# Patient Record
Sex: Male | Born: 1975 | Race: White | Hispanic: No | Marital: Married | Smoking: Current every day smoker
Health system: Southern US, Community
[De-identification: ages and names within clinical notes are randomized; demographics above are authoritative.]

## PROBLEM LIST (undated history)

## (undated) DIAGNOSIS — G5600 Carpal tunnel syndrome, unspecified upper limb: Secondary | ICD-10-CM

## (undated) DIAGNOSIS — K219 Gastro-esophageal reflux disease without esophagitis: Secondary | ICD-10-CM

## (undated) DIAGNOSIS — M779 Enthesopathy, unspecified: Secondary | ICD-10-CM

## (undated) DIAGNOSIS — G2581 Restless legs syndrome: Secondary | ICD-10-CM

## (undated) DIAGNOSIS — F419 Anxiety disorder, unspecified: Secondary | ICD-10-CM

## (undated) DIAGNOSIS — J45909 Unspecified asthma, uncomplicated: Secondary | ICD-10-CM

## (undated) DIAGNOSIS — T4145XA Adverse effect of unspecified anesthetic, initial encounter: Secondary | ICD-10-CM

## (undated) DIAGNOSIS — T8859XA Other complications of anesthesia, initial encounter: Secondary | ICD-10-CM

## (undated) DIAGNOSIS — I10 Essential (primary) hypertension: Secondary | ICD-10-CM

## (undated) DIAGNOSIS — J189 Pneumonia, unspecified organism: Secondary | ICD-10-CM

## (undated) DIAGNOSIS — G4730 Sleep apnea, unspecified: Secondary | ICD-10-CM

## (undated) HISTORY — PX: CHOLECYSTECTOMY: SHX55

## (undated) HISTORY — PX: PILONIDAL CYST EXCISION: SHX744

---

## 2004-07-11 ENCOUNTER — Ambulatory Visit: Payer: Self-pay | Admitting: Family Medicine

## 2005-02-16 ENCOUNTER — Ambulatory Visit: Payer: Self-pay | Admitting: Family Medicine

## 2005-03-02 ENCOUNTER — Ambulatory Visit: Payer: Self-pay | Admitting: Family Medicine

## 2012-04-11 ENCOUNTER — Ambulatory Visit
Admission: RE | Admit: 2012-04-11 | Discharge: 2012-04-11 | Disposition: A | Discharge status: Home / Self Care | Payer: BC Managed Care – PPO | Source: Ambulatory Visit | Attending: Family Medicine | Admitting: Family Medicine

## 2012-04-11 ENCOUNTER — Other Ambulatory Visit: Payer: Self-pay | Admitting: Family Medicine

## 2012-04-11 DIAGNOSIS — R109 Unspecified abdominal pain: Secondary | ICD-10-CM

## 2013-08-16 IMAGING — CR DG ABDOMEN 1V
2 series · 2 of 2 positions shown · non-contrast
Comparison: None.

CLINICAL DATA: Lower abdominal pain, constipation.

ABDOMEN - 1 VIEW

[t abdomen supine (1 of 2)]
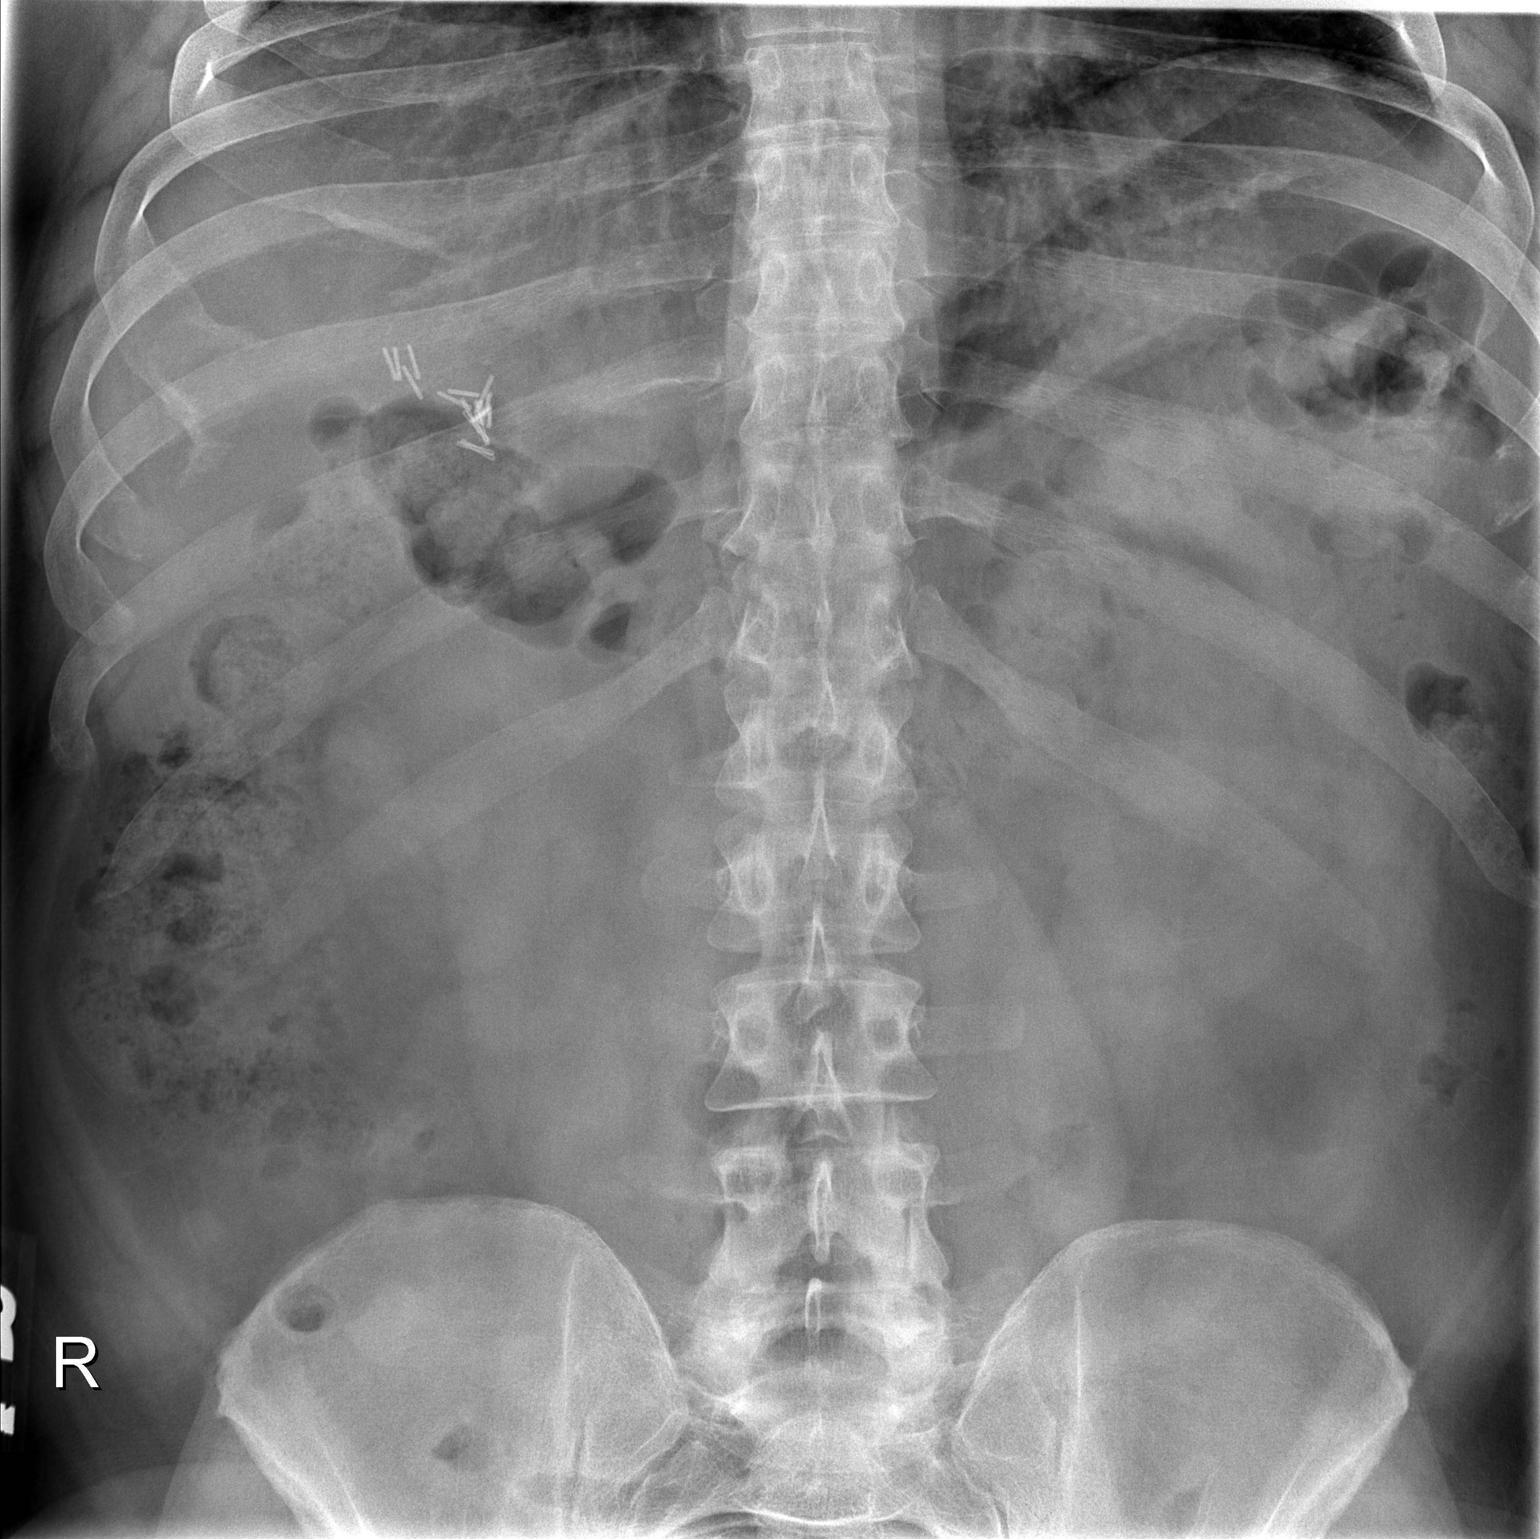

[t abdomen supine (2 of 2)]
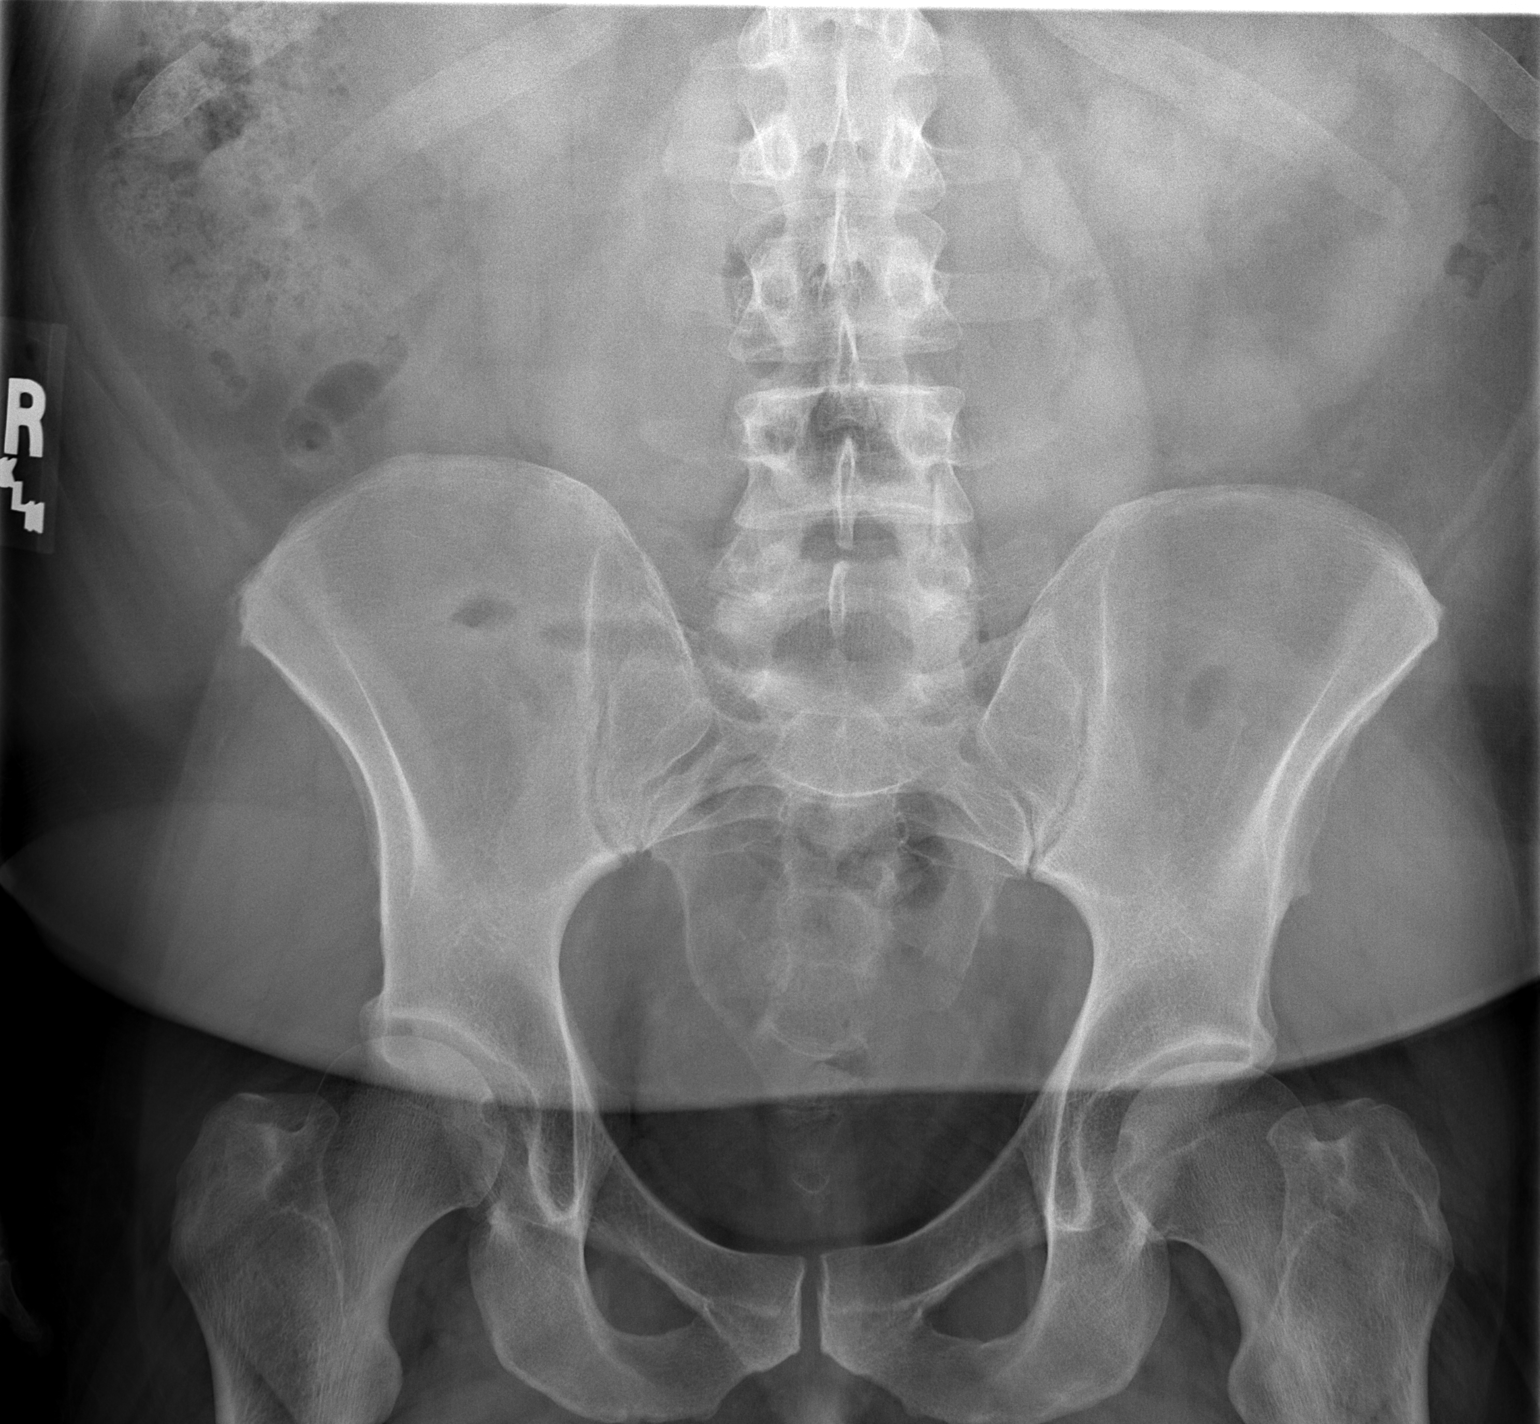

[2 of 2 positions shown; findings below may reference images not displayed]

FINDINGS: Moderate stool burden throughout the colon.  No evidence
of obstruction or free air.  Prior cholecystectomy.  No
organomegaly or suspicious calcification.  No acute bony
abnormality.
IMPRESSION: Moderate stool burden.  No acute findings.

## 2014-07-13 ENCOUNTER — Emergency Department (HOSPITAL_COMMUNITY)
Admission: EM | Admit: 2014-07-13 | Discharge: 2014-07-13 | Disposition: A | Discharge status: Home / Self Care | Payer: BC Managed Care – PPO | Source: Home / Self Care | Attending: Family Medicine | Admitting: Family Medicine

## 2014-07-13 ENCOUNTER — Encounter (HOSPITAL_COMMUNITY): Payer: Self-pay | Admitting: Emergency Medicine

## 2014-07-13 ENCOUNTER — Other Ambulatory Visit: Payer: Self-pay

## 2014-07-13 DIAGNOSIS — R000 Tachycardia, unspecified: Secondary | ICD-10-CM

## 2014-07-13 DIAGNOSIS — F439 Reaction to severe stress, unspecified: Secondary | ICD-10-CM

## 2014-07-13 DIAGNOSIS — F458 Other somatoform disorders: Secondary | ICD-10-CM

## 2014-07-13 DIAGNOSIS — F430 Acute stress reaction: Secondary | ICD-10-CM

## 2014-07-13 DIAGNOSIS — R0989 Other specified symptoms and signs involving the circulatory and respiratory systems: Secondary | ICD-10-CM

## 2014-07-13 DIAGNOSIS — I158 Other secondary hypertension: Secondary | ICD-10-CM

## 2014-07-13 DIAGNOSIS — J069 Acute upper respiratory infection, unspecified: Secondary | ICD-10-CM

## 2014-07-13 HISTORY — DX: Essential (primary) hypertension: I10

## 2014-07-13 MED ORDER — METOPROLOL TARTRATE 25 MG PO TABS: 25.0000 mg | ORAL_TABLET | Freq: Two times a day (BID) | ORAL | Status: DC

## 2014-07-13 MED ORDER — LORAZEPAM 1 MG PO TABS: 1.0000 mg | ORAL_TABLET | Freq: Once | ORAL | Status: DC

## 2014-07-13 NOTE — ED Notes (Signed)
Pt states he thinks he might be having a reaction to cross contamination of seafood from a restaurant last night.  He had tongue swelling, SOB, & HBP last night.  It went away with Benadryl, until this morning at work, where his BP was  180/120 and the same symptoms arose.  Pt has a hx of HBP and is supposed to be on medication, but stated it was too expensive so he has not taken it in a long time.

## 2014-07-13 NOTE — ED Provider Notes (Signed)
CSN: [id number]     Arrival date & time 07/13/14  [id number] History   First MD Initiated Contact with Patient 07/13/14 [phone]     Chief Complaint  Patient presents with  . Hypertension  . Nausea    waves, not currently  . Chest Pain    waves, not currently   (Consider location/radiation/quality/duration/timing/severity/associated sxs/prior Treatment) HPI Comments: 38 year old obese male was eating at a restaurant last night around 7 PM. 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 He was using a cheese dip and during the meal he developed a sensation of swelling in throat and time and chest tightness. He took Benadryl and went to bed after he went home. He felt okay earlier this morning and he will he started developing some of the same symptoms. He has a history of bronchospasm and states that he has had a cough, congestion, occasional shortness of breath and tightness in the mid chest that he usually develops with bronchospasm. His blood pressure was measured at work to be 180/120. At the urgent care is 150/104. His heart rate is 125. 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   Past Medical History  Diagnosis Date  . Hypertension    History reviewed. No pertinent past surgical history. History reviewed. No pertinent family history. History  Substance Use Topics  . Smoking status: Former Smoker    Types: Cigarettes    Quit date: 01/11/2014  . Smokeless tobacco: Never Used  . Alcohol Use: Yes     Comment: occasionally    Review of Systems  Constitutional: Positive for fever, chills and activity change.  HENT: Positive for congestion. Negative for ear pain, postnasal drip, rhinorrhea, sore throat and trouble swallowing.   Eyes: Negative.   Respiratory: Positive for cough, chest tightness and shortness of breath. Negative for apnea.   Cardiovascular: Negative for leg swelling.  Gastrointestinal: Negative for vomiting and  abdominal pain.  Genitourinary: Negative.   Skin: Negative for rash.  Neurological: Positive for light-headedness. Negative for tremors, syncope and speech difficulty.  Psychiatric/Behavioral: The patient is nervous/anxious.     Allergies  [place]  Home Medications   Prior to Admission medications   Medication Sig Start Date End Date Taking? Authorizing Provider  metoprolol tartrate (LOPRESSOR) 25 MG tablet Take 1 tablet (25 mg total) by mouth 2 (two) times daily. 07/13/14   Hayden Rasmussenavid Joselinne Lawal, NP   BP 150/104 mmHg  Pulse 125  Temp(Src) 99.1 F (37.3 C) (Oral)  Resp 16  SpO2 99% Physical Exam  Constitutional: He is oriented to person, place, and time. He appears well-developed and well-nourished.  HENT:  Head: Normocephalic and atraumatic.  Bilateral TMs are normal Oropharynx is clear and moist without exudates. There is mild clear PND. No intraoral swelling or edema.   Tongues is of normal size without erythema. Oropharynx is widely papatent.  Eyes: Conjunctivae and EOM are normal. Pupils are equal, round, and reactive to light.  Neck: Normal range of motion. Neck supple.  Cardiovascular: Normal rate, regular rhythm, normal heart sounds and intact distal pulses.   No murmur heard. Pulmonary/Chest: Effort normal and breath sounds normal. No respiratory distress. He has no wheezes. He has no rales.  Musculoskeletal: Normal range of motion. He exhibits no edema.  Lymphadenopathy:    He has no cervical adenopathy.  Neurological: He is alert and oriented to person, place, and time.  Skin: Skin is warm and dry.  Psychiatric: He has a normal mood and affect.  Nursing note and vitals reviewed.   ED Course  Procedures (including critical care time) Labs Review Labs Reviewed - No data to display EKG: sinus tachycardia @ 115. No ectopy. No S-T abnormalities. Nl axis.   Imaging Review No results found.   MDM   1. Stress reaction, emotional   2. URI (upper respiratory infection)    3. Other secondary hypertension   4. Globus pharyngeus   5. Sinus tachycardia by electrocardiogram     Patient's physical exam is essentially unremarkable. He does have some PND and tachycardia. He is visibly anxious. There is no evidence of an allergic reaction. His pulmonary and will exam is normal. There is no rash. EKG is as above. Due to the accommodation of excessive stress reaction and his physical responses tachycardia and elevated blood pressure will treat with Lopressor 25 mg twice a day until he can see his doctor in a couple of weeks. For any side effect such as excessive fatigue, tiredness or other may decrease his dosage by half or stop it altogether. Also for increased wheezing he may stop it. There was a long and detailed discussion with the patient and his wife regarding the atypical chest tightness that he has been having last night and today. He was advised that the EKG cannot determine all anginal occurrences and he cannot be sure by the urgent care that he does not have an ongoing heart problem at this time which may lead to heart attack, debilitation or death. All of these have been explained to him and his wife and they both stated they fully understand. If there is any worsening, new symptoms or problems they are to call EMS or go to the emergency department stat. They state they understand. The patient has declined to accept the recommendation to go to the emergency department for evaluation. His wife will take him home.      Hayden Rasmussenavid Evany Schecter, NP 07/13/14 1020

## 2014-07-13 NOTE — Discharge Instructions (Signed)
Have recommended that you go to the ED for evaluation of chest tightness associated with risk factors of heart disease. Our work up is limited as well as the EKG in determining current heart problems.  Hypertension Hypertension, commonly called high blood pressure, is when the force of blood pumping through your arteries is too strong. Your arteries are the blood vessels that carry blood from your heart throughout your body. A blood pressure reading consists of a higher number over a lower number, such as 110/72. The higher number (systolic) is the pressure inside your arteries when your heart pumps. The lower number (diastolic) is the pressure inside your arteries when your heart relaxes. Ideally you want your blood pressure below 120/80. Hypertension forces your heart to work harder to pump blood. Your arteries may become narrow or stiff. Having hypertension puts you at risk for heart disease, stroke, and other problems.  RISK FACTORS Some risk factors for high blood pressure are controllable. Others are not.  Risk factors you cannot control include:   Race. You may be at higher risk if you are African American.  Age. Risk increases with age.  Gender. Men are at higher risk than women before age 3 years. After age 19, women are at higher risk than men. Risk factors you can control include:  Not getting enough exercise or physical activity.  Being overweight.  Getting too much fat, sugar, calories, or salt in your diet.  Drinking too much alcohol. SIGNS AND SYMPTOMS Hypertension does not usually cause signs or symptoms. Extremely high blood pressure (hypertensive crisis) may cause headache, anxiety, shortness of breath, and nosebleed. DIAGNOSIS  To check if you have hypertension, your health care provider will measure your blood pressure while you are seated, with your arm held at the level of your heart. It should be measured at least twice using the same arm. Certain conditions can cause  a difference in blood pressure between your right and left arms. A blood pressure reading that is higher than normal on one occasion does not mean that you need treatment. If one blood pressure reading is high, ask your health care provider about having it checked again. TREATMENT  Treating high blood pressure includes making lifestyle changes and possibly taking medicine. Living a healthy lifestyle can help lower high blood pressure. You may need to change some of your habits. Lifestyle changes may include:  Following the DASH diet. This diet is high in fruits, vegetables, and whole grains. It is low in salt, red meat, and added sugars.  Getting at least 2 hours of brisk physical activity every week.  Losing weight if necessary.  Not smoking.  Limiting alcoholic beverages.  Learning ways to reduce stress. If lifestyle changes are not enough to get your blood pressure under control, your health care provider may prescribe medicine. You may need to take more than one. Work closely with your health care provider to understand the risks and benefits. HOME CARE INSTRUCTIONS  Have your blood pressure rechecked as directed by your health care provider.   Take medicines only as directed by your health care provider. Follow the directions carefully. Blood pressure medicines must be taken as prescribed. The medicine does not work as well when you skip doses. Skipping doses also puts you at risk for problems.   Do not smoke.   Monitor your blood pressure at home as directed by your health care provider. SEEK MEDICAL CARE IF:   You think you are having a reaction to medicines  taken.  You have recurrent headaches or feel dizzy.  You have swelling in your ankles.  You have trouble with your vision. SEEK IMMEDIATE MEDICAL CARE IF:  You develop a severe headache or confusion.  You have unusual weakness, numbness, or feel faint.  You have severe chest or abdominal pain.  You vomit  repeatedly.  You have trouble breathing. MAKE SURE YOU:   Understand these instructions.  Will watch your condition.  Will get help right away if you are not doing well or get worse. Document Released: 07/20/2005 Document Revised: 12/04/2013 Document Reviewed: 05/12/2013 [facility] Patient Information 2015 [place], [place]. This information is not intended to replace advice given to you by your health care provider. Make sure you discuss any questions you have with your health care provider.  Stress and Stress Management Stress is a normal reaction to life events. It is what you feel when life demands more than you are used to or more than you can handle. Some stress can be useful. For example, the stress reaction can help you catch the last bus of the day, study for a test, or meet a deadline at work. But stress that occurs too often or for too long can cause problems. It can affect your emotional health and interfere with relationships and normal daily activities. Too much stress can weaken your immune system and increase your risk for physical illness. If you already have a medical problem, stress can make it worse. CAUSES  All sorts of life events may cause stress. An event that causes stress for one person may not be stressful for another person. Major life events commonly cause stress. These may be positive or negative. Examples include losing your job, moving into a new home, getting married, having a baby, or losing a loved one. Less obvious life events may also cause stress, especially if they occur day after day or in combination. Examples include working long hours, driving in traffic, caring for children, being in debt, or being in a difficult relationship. SIGNS AND SYMPTOMS Stress may cause emotional symptoms including, the following:  Anxiety. This is feeling worried, afraid, on edge, overwhelmed, or out of control.  Anger. This is feeling irritated or impatient.  Depression. This  is feeling sad, down, helpless, or guilty.  Difficulty focusing, remembering, or making decisions. Stress may cause physical symptoms, including the following:   Aches and pains. These may affect your head, neck, back, stomach, or other areas of your body.  Tight muscles or clenched jaw.  Low energy or trouble sleeping. Stress may cause unhealthy behaviors, including the following:   Eating to feel better (overeating) or skipping meals.  Sleeping too little, too much, or both.  Working too much or putting off tasks (procrastination).  Smoking, drinking alcohol, or using drugs to feel better. DIAGNOSIS  Stress is diagnosed through an assessment by your health care provider. Your health care provider will ask questions about your symptoms and any stressful life events.Your health care provider will also ask about your medical history and may order blood tests or other tests. Certain medical conditions and medicine can cause physical symptoms similar to stress. Mental illness can cause emotional symptoms and unhealthy behaviors similar to stress. Your health care provider may refer you to a mental health professional for further evaluation.  TREATMENT  Stress management is the recommended treatment for stress.The goals of stress management are reducing stressful life events and coping with stress in healthy ways.  Techniques for reducing stressful life  events include the following:  Stress identification. Self-monitor for stress and identify what causes stress for you. These skills may help you to avoid some stressful events.  Time management. Set your priorities, keep a calendar of events, and learn to say "no." These tools can help you avoid making too many commitments. Techniques for coping with stress include the following:  Rethinking the problem. Try to think realistically about stressful events rather than ignoring them or overreacting. Try to find the positives in a stressful  situation rather than focusing on the negatives.  Exercise. Physical exercise can release both physical and emotional tension. The key is to find a form of exercise you enjoy and do it regularly.  Relaxation techniques. These relax the body and mind. Examples include yoga, meditation, tai chi, biofeedback, deep breathing, progressive muscle relaxation, listening to music, being out in nature, journaling, and other hobbies. Again, the key is to find one or more that you enjoy and can do regularly.  Healthy lifestyle. Eat a balanced diet, get plenty of sleep, and do not smoke. Avoid using alcohol or drugs to relax.  Strong support network. Spend time with family, friends, or other people you enjoy being around.Express your feelings and talk things over with someone you trust. Counseling or talktherapy with a mental health professional may be helpful if you are having difficulty managing stress on your own. Medicine is typically not recommended for the treatment of stress.Talk to your health care provider if you think you need medicine for symptoms of stress. HOME CARE INSTRUCTIONS  Keep all follow-up visits as directed by your health care provider.  Take all medicines as directed by your health care provider. SEEK MEDICAL CARE IF:  Your symptoms get worse or you start having new symptoms.  You feel overwhelmed by your problems and can no longer manage them on your own. SEEK IMMEDIATE MEDICAL CARE IF:  You feel like hurting yourself or someone else. Document Released: 01/13/2001 Document Revised: 12/04/2013 Document Reviewed: 03/14/2013 [facility] Patient Information 2015 [place], [place]. This information is not intended to replace advice given to you by your health care provider. Make sure you discuss any questions you have with your health care provider.  Upper Respiratory Infection, Adult An upper respiratory infection (URI) is also known as the common cold. It is often caused by a type  of germ (virus). Colds are easily spread (contagious). You can pass it to others by kissing, coughing, sneezing, or drinking out of the same glass. Usually, you get better in 1 or 2 weeks.  HOME CARE   Only take medicine as told by your doctor.  Use a warm mist humidifier or breathe in steam from a hot shower.  Drink enough water and fluids to keep your pee (urine) clear or pale yellow.  Get plenty of rest.  Return to work when your temperature is back to normal or as told by your doctor. You may use a face mask and wash your hands to stop your cold from spreading. GET HELP RIGHT AWAY IF:   After the first few days, you feel you are getting worse.  You have questions about your medicine.  You have chills, shortness of breath, or brown or red spit (mucus).  You have yellow or brown snot (nasal discharge) or pain in the face, especially when you bend forward.  You have a fever, puffy (swollen) neck, pain when you swallow, or white spots in the back of your throat.  You have a bad headache,  ear pain, sinus pain, or chest pain.  You have a high-pitched whistling sound when you breathe in and out (wheezing).  You have a lasting cough or cough up blood.  You have sore muscles or a stiff neck. MAKE SURE YOU:   Understand these instructions.  Will watch your condition.  Will get help right away if you are not doing well or get worse. Document Released: 01/06/2008 Document Revised: 10/12/2011 Document Reviewed: 10/25/2013 [facility] Patient Information 2015 [place], [place]. This information is not intended to replace advice given to you by your health care provider. Make sure you discuss any questions you have with your health care provider.  Globus Syndrome Globus Syndrome is a feeling of a lump or a sensation of something caught in your throat. Eating food or drinking fluids does not seem to get rid of it. Yet it is not noticeable during the actual act of swallowing food or liquids.  Usually there is nothing physically wrong. It is troublesome because it is an unpleasant sensation which is sometimes difficult to ignore and at times may seem to worsen. The syndrome is quite common. It is estimated 45% of the population experiences features of the condition at some stage during their lives. The symptoms are usually temporary. The largest group of people who feel the need to seek medical treatment is females between the ages of 65 to 32.  CAUSES  Globus Syndrome appears to be triggered by or aggravated by stress, anxiety and depression.  Tension related to stress could product abnormal muscle spasms in the esophagus which would account for the sensation of a lump or ball in your throat.  Frequent swallowing or drying of the throat caused by anxiety or other strong emotions can also produce this uncomfortable sensation in your throat.  Fear and sadness can be expressed by the body in many ways. For instance, if you had a relative with throat cancer you might become overly concerned about your own health and develop uncomfortable sensations in your throat.  The reaction to a crisis or a trauma event in your life can take the form of a lump in your throat. It is as if you are indirectly saying you can not handle or "swallow" one more thing. DIAGNOSIS  Usually your caregiver will know what is wrong by talking to you and examining you. If the condition persists for several days, more testing may be done to make sure there is not another problem present. This is usually not the case. TREATMENT   Reassurance is often the best treatment available. Usually the problem leaves without treatment over several days.  Sometimes anti-anxiety medications may be prescribed.  Counseling or talk therapy can also help with strong underlying emotions.  Note that in most cases this is not something that keeps coming back and you should not be concerned or worried. Document Released: 10/10/2003  Document Revised: 10/12/2011 Document Reviewed: 03/08/2008 [facility] Patient Information 2015 [place], [place]. This information is not intended to replace advice given to you by your health care provider. Make sure you discuss any questions you have with your health care provider.

## 2014-07-15 ENCOUNTER — Emergency Department (HOSPITAL_COMMUNITY): Payer: BC Managed Care – PPO

## 2014-07-15 ENCOUNTER — Encounter (HOSPITAL_COMMUNITY): Payer: Self-pay | Admitting: *Deleted

## 2014-07-15 ENCOUNTER — Emergency Department (HOSPITAL_COMMUNITY)
Admission: EM | Admit: 2014-07-15 | Discharge: 2014-07-15 | Disposition: A | Discharge status: Home / Self Care | Payer: BC Managed Care – PPO | Attending: Emergency Medicine | Admitting: Emergency Medicine

## 2014-07-15 DIAGNOSIS — I10 Essential (primary) hypertension: Secondary | ICD-10-CM | POA: Diagnosis not present

## 2014-07-15 DIAGNOSIS — F419 Anxiety disorder, unspecified: Secondary | ICD-10-CM

## 2014-07-15 DIAGNOSIS — R55 Syncope and collapse: Secondary | ICD-10-CM | POA: Insufficient documentation

## 2014-07-15 DIAGNOSIS — R531 Weakness: Secondary | ICD-10-CM | POA: Diagnosis not present

## 2014-07-15 DIAGNOSIS — Z79899 Other long term (current) drug therapy: Secondary | ICD-10-CM | POA: Diagnosis not present

## 2014-07-15 DIAGNOSIS — Z87891 Personal history of nicotine dependence: Secondary | ICD-10-CM | POA: Insufficient documentation

## 2014-07-15 DIAGNOSIS — R079 Chest pain, unspecified: Secondary | ICD-10-CM | POA: Diagnosis present

## 2014-07-15 DIAGNOSIS — R0602 Shortness of breath: Secondary | ICD-10-CM | POA: Insufficient documentation

## 2014-07-15 LAB — BASIC METABOLIC PANEL
ANION GAP: 15 (ref 5–15)
BUN: 10 mg/dL (ref 6–23)
CALCIUM: 10.5 mg/dL (ref 8.4–10.5)
CO2: 24 meq/L (ref 19–32)
CREATININE: 0.71 mg/dL (ref 0.50–1.35)
Chloride: 100 mEq/L (ref 96–112)
GFR calc Af Amer: >90 mL/min (ref >90)
GFR calc non Af Amer: >90 mL/min (ref >90)
Glucose, Bld: 109 mg/dL — ABNORMAL HIGH (ref 70–99)
Potassium: 4 mEq/L (ref 3.7–5.3)
Sodium: 139 mEq/L (ref 137–147)

## 2014-07-15 LAB — CBC
HCT: 46.2 % (ref 39.0–52.0)
Hemoglobin: 15.8 g/dL (ref 13.0–17.0)
MCH: 31 pg (ref 26.0–34.0)
MCHC: 34.2 g/dL (ref 30.0–36.0)
MCV: 90.8 fL (ref 78.0–100.0)
PLATELETS: 333 10*3/uL (ref 150–400)
RBC: 5.09 MIL/uL (ref 4.22–5.81)
RDW: 12.9 % (ref 11.5–15.5)
WBC: 8.4 10*3/uL (ref 4.0–10.5)

## 2014-07-15 LAB — I-STAT TROPONIN, ED
Troponin i, poc: 0 ng/mL (ref 0.00–0.08)
Troponin i, poc: 0 ng/mL (ref 0.00–0.08)

## 2014-07-15 MED ORDER — LORAZEPAM 1 MG PO TABS
1.0000 mg | ORAL_TABLET | Freq: Once | ORAL | Status: AC
  Administered 2014-07-15: 1 mg via ORAL
  Filled 2014-07-15: qty 1

## 2014-07-15 MED ORDER — LORAZEPAM 1 MG PO TABS: 1.0000 mg | ORAL_TABLET | Freq: Three times a day (TID) | ORAL | Status: DC | PRN

## 2014-07-15 NOTE — Discharge Instructions (Signed)
Your lab work and x-rays are good today. Take ativan as prescribed as needed for anxiety. Follow up as scheduled with your doctor. Return if worsening.   Chest Pain (Nonspecific) It is often hard to give a specific diagnosis for the cause of chest pain. There is always a chance that your pain could be related to something serious, such as a heart attack or a blood clot in the lungs. You need to follow up with your health care provider for further evaluation. CAUSES   Heartburn.  Pneumonia or bronchitis.  Anxiety or stress.  Inflammation around your heart (pericarditis) or lung (pleuritis or pleurisy).  A blood clot in the lung.  A collapsed lung (pneumothorax). It can develop suddenly on its own (spontaneous pneumothorax) or from trauma to the chest.  Shingles infection (herpes zoster virus). The chest wall is composed of bones, muscles, and cartilage. Any of these can be the source of the pain.  The bones can be bruised by injury.  The muscles or cartilage can be strained by coughing or overwork.  The cartilage can be affected by inflammation and become sore (costochondritis). DIAGNOSIS  Lab tests or other studies may be needed to find the cause of your pain. Your health care provider may have you take a test called an ambulatory electrocardiogram (ECG). An ECG records your heartbeat patterns over a 24-hour period. You may also have other tests, such as:  Transthoracic echocardiogram (TTE). During echocardiography, sound waves are used to evaluate how blood flows through your heart.  Transesophageal echocardiogram (TEE).  Cardiac monitoring. This allows your health care provider to monitor your heart rate and rhythm in real time.  Holter monitor. This is a portable device that records your heartbeat and can help diagnose heart arrhythmias. It allows your health care provider to track your heart activity for several days, if needed.  Stress tests by exercise or by giving medicine  that makes the heart beat faster. TREATMENT   Treatment depends on what may be causing your chest pain. Treatment may include:  Acid blockers for heartburn.  Anti-inflammatory medicine.  Pain medicine for inflammatory conditions.  Antibiotics if an infection is present.  You may be advised to change lifestyle habits. This includes stopping smoking and avoiding alcohol, caffeine, and chocolate.  You may be advised to keep your head raised (elevated) when sleeping. This reduces the chance of acid going backward from your stomach into your esophagus. Most of the time, nonspecific chest pain will improve within 2-3 days with rest and mild pain medicine.  HOME CARE INSTRUCTIONS   If antibiotics were prescribed, take them as directed. Finish them even if you start to feel better.  For the next few days, avoid physical activities that bring on chest pain. Continue physical activities as directed.  Do not use any tobacco products, including cigarettes, chewing tobacco, or electronic cigarettes.  Avoid drinking alcohol.  Only take medicine as directed by your health care provider.  Follow your health care provider's suggestions for further testing if your chest pain does not go away.  Keep any follow-up appointments you made. If you do not go to an appointment, you could develop lasting (chronic) problems with pain. If there is any problem keeping an appointment, call to reschedule. SEEK MEDICAL CARE IF:   Your chest pain does not go away, even after treatment.  You have a rash with blisters on your chest.  You have a fever. SEEK IMMEDIATE MEDICAL CARE IF:   You have increased chest  pain or pain that spreads to your arm, neck, jaw, back, or abdomen.  You have shortness of breath.  You have an increasing cough, or you cough up blood.  You have severe back or abdominal pain.  You feel nauseous or vomit.  You have severe weakness.  You faint.  You have chills. This is an  emergency. Do not wait to see if the pain will go away. Get medical help at once. Call your local emergency services (911 in [place] Do not drive yourself to the hospital. MAKE SURE YOU:   Understand these instructions.  Will watch your condition.  Will get help right away if you are not doing well or get worse. Document Released: 04/29/2005 Document Revised: 07/25/2013 Document Reviewed: 02/23/2008 [facility] Patient Information 2015 [employer], [place]. This information is not intended to replace advice given to you by your health care provider. Make sure you discuss any questions you have with your health care provider.

## 2014-07-15 NOTE — ED Provider Notes (Signed)
CSN: [id number]     Arrival date & time 07/15/14  1221 History   First MD Initiated Contact with Patient 07/15/14 1304     Chief Complaint  Patient presents with  . Chest Pain  . Near Syncope     (Consider location/radiation/quality/duration/timing/severity/associated sxs/prior Treatment) HPI Eduardo Robinson is a 38 y.o. male history of hypertension, presents to emergency department complaining of episodes of chest pressure, associated with dizziness and weakness. States symptoms began last week. He reports episodes come and go, states he has had 2 episodes last Thursday, to last Friday, none Saturday after studying blood pressure medications, and had an episode this morning. Episodes are independent of activity, independent of eating, states nothing is making them better or worse. States that last several minutes up to an hour. Reports retrosternal pressure, dizziness, weakness, states "feel like my legs collapsing underneath me." States he had difficulty walking during episode this morning because of the weakness in his legs. States he went to urgent care where he was started on blood pressure medications. Patient denies any other medical problems, states he does have family history of cardiac disease, and his father and grandfather, both with MIs after age of 39. He is a former smoker. States that present symptoms are improving. Denies any fever, chills. No upper respiratory type symptoms. No headache. No neuro deficits. He admits to being stressed out at work, states that he is under a lot of pressure and stress and having hard time dealing with it.  Past Medical History  Diagnosis Date  . Hypertension    History reviewed. No pertinent past surgical history. History reviewed. No pertinent family history. History  Substance Use Topics  . Smoking status: Former Smoker    Types: Cigarettes    Quit date: 01/11/2014  . Smokeless tobacco: Never Used  . Alcohol Use: Yes     Comment:  occasionally    Review of Systems  Constitutional: Negative for fever and chills.  Respiratory: Positive for chest tightness and shortness of breath. Negative for cough.   Cardiovascular: Positive for chest pain. Negative for palpitations and leg swelling.  Gastrointestinal: Negative for nausea, vomiting, abdominal pain, diarrhea and abdominal distention.  Musculoskeletal: Negative for myalgias, arthralgias, neck pain and neck stiffness.  Skin: Negative for rash.  Allergic/Immunologic: Negative for immunocompromised state.  Neurological: Positive for weakness. Negative for dizziness, light-headedness, numbness and headaches.  All other systems reviewed and are negative.     Allergies  Scallops  Home Medications   Prior to Admission medications   Medication Sig Start Date End Date Taking? Authorizing Provider  loratadine (CLARITIN) 10 MG tablet Take 10 mg by mouth daily.   Yes Historical Provider, MD  metoprolol tartrate (LOPRESSOR) 25 MG tablet Take 1 tablet (25 mg total) by mouth 2 (two) times daily. Patient taking differently: Take 12.5 mg by mouth 2 (two) times daily.  07/13/14  Yes Hayden Rasmussenavid Mabe, NP   BP 118/82 mmHg  Pulse 86  Temp(Src) 98.2 F (36.8 C) (Oral)  Resp 20  Ht 5\' 11"  (1.803 m)  Wt 285 lb (129.275 kg)  BMI 39.77 kg/m2  SpO2 100% Physical Exam  Constitutional: He is oriented to person, place, and time. He appears well-developed and well-nourished. No distress.  HENT:  Head: Normocephalic and atraumatic.  Eyes: Conjunctivae are normal.  Neck: Neck supple.  Cardiovascular: Normal rate, regular rhythm and normal heart sounds.   Pulmonary/Chest: Effort normal. No respiratory distress. He has no wheezes. He has no rales. He exhibits no  tenderness.  Abdominal: Soft. Bowel sounds are normal. He exhibits no distension. There is no tenderness. There is no rebound.  Musculoskeletal: He exhibits no edema.  Neurological: He is alert and oriented to person, place, and  time.  Skin: Skin is warm and dry.  Psychiatric:  Appears anxious  Nursing note and vitals reviewed.   ED Course  Procedures (including critical care time) Labs Review Labs Reviewed  BASIC METABOLIC PANEL - Abnormal; Notable for the following:    Glucose, Bld 109 (*)    All other components within normal limits  CBC  I-STAT TROPOININ, ED  I-STAT TROPOININ, ED    Imaging Review Dg Chest 2 View  07/15/2014   CLINICAL DATA:  38 year old male with 1 week history of intermittent chest pain, shortness of breath, dizziness, nausea and lethargy  EXAM: CHEST  2 VIEW  COMPARISON:  Prior chest x-ray 02/08/2012  FINDINGS: The lungs are clear and negative for focal airspace consolidation, pulmonary edema or suspicious pulmonary nodule. No pleural effusion or pneumothorax. Cardiac and mediastinal contours are within normal limits. No acute fracture or lytic or blastic osseous lesions. The visualized upper abdominal bowel gas pattern is unremarkable. Surgical clips in the right upper quadrant suggest prior cholecystectomy.  IMPRESSION: No active cardiopulmonary disease.   Electronically Signed   By: Malachy MoanHeath  McCullough M.D.   On: 07/15/2014 13:23     EKG Interpretation   Date/Time:  Sunday July 15 2014 12:29:13 EST Ventricular Rate:  101 PR Interval:  154 QRS Duration: 84 QT Interval:  346 QTC Calculation: 448 R Axis:   24 Text Interpretation:  Sinus tachycardia Otherwise normal ECG No acute  changes Confirmed by Rhunette CroftNANAVATI, MD, Janey GentaANKIT [phone]([id number]) on 07/15/2014 1:37:18 PM      MDM   Final diagnoses:  Chest pain, unspecified chest pain type  Anxiety    Patient with atypical chest pain. Risk factors include hypertension, family history, however and father and grandfather after age of 39. He is a former smoker. He does appear to be very stressed out and anxious. Will try Ativan. Labs pending.   Patient is feeling much better after Ativan. He ambulated back and forth in the hallway to and  from the bathroom. He states his pain is resolved at this time. Will get a delta troponin, will monitor. Pt is low rik for ACS. Doubt PE given normal VS.   4:23 PM Delta troponin negative. Pt symptom free. Suspect most likely anxiety related. Pt has follow up scheduled in 2 days. Will dc home. VS normal here.  Pt non toxic appearing.   Filed Vitals:   07/15/14 1535 07/15/14 1545 07/15/14 1600 07/15/14 1615  BP: 104/58 114/66 115/70 107/66  Pulse: 99 83 83 72  Temp:      TempSrc:      Resp: 18 10 10 12   Height:      Weight:      SpO2: 99% 97% 97% 92%     Lottie Musselatyana A Jahir Halt, PA-C 07/15/14 1626  Derwood KaplanAnkit Nanavati, MD 07/15/14 1651

## 2014-07-15 NOTE — ED Notes (Signed)
Pt reports mid chest tightness, bilateral leg pain, feeling very weak and near syncopal, sob. Pt reports going to [facility] on Friday for same and started on bp meds. ekg done at triage.

## 2016-05-27 ENCOUNTER — Encounter (HOSPITAL_COMMUNITY): Payer: Self-pay | Admitting: *Deleted

## 2016-05-27 MED ORDER — CEFAZOLIN SODIUM 10 G IJ SOLR
3.0000 g | INTRAMUSCULAR | Status: AC
  Administered 2016-05-28: 3 g via INTRAVENOUS
  Filled 2016-05-27: qty 3000

## 2016-05-27 NOTE — Progress Notes (Signed)
Spoke with pt for pre-op call. Pt denies cardiac history, chest pain or sob. 

## 2016-05-28 ENCOUNTER — Ambulatory Visit (HOSPITAL_COMMUNITY): Payer: BC Managed Care – PPO | Admitting: Anesthesiology

## 2016-05-28 ENCOUNTER — Ambulatory Visit (HOSPITAL_COMMUNITY)
Admission: RE | Admit: 2016-05-28 | Discharge: 2016-05-28 | Disposition: A | Discharge status: Home / Self Care | Payer: BC Managed Care – PPO | Source: Ambulatory Visit | Attending: Orthopedic Surgery | Admitting: Orthopedic Surgery

## 2016-05-28 ENCOUNTER — Encounter (HOSPITAL_COMMUNITY): Payer: Self-pay | Admitting: Certified Registered"

## 2016-05-28 ENCOUNTER — Ambulatory Visit (HOSPITAL_COMMUNITY): Payer: BC Managed Care – PPO

## 2016-05-28 ENCOUNTER — Encounter (HOSPITAL_COMMUNITY)
Admission: RE | Disposition: A | Discharge status: Home / Self Care | Payer: Self-pay | Source: Ambulatory Visit | Attending: Orthopedic Surgery

## 2016-05-28 DIAGNOSIS — K219 Gastro-esophageal reflux disease without esophagitis: Secondary | ICD-10-CM | POA: Insufficient documentation

## 2016-05-28 DIAGNOSIS — I10 Essential (primary) hypertension: Secondary | ICD-10-CM | POA: Insufficient documentation

## 2016-05-28 DIAGNOSIS — Z91013 Allergy to seafood: Secondary | ICD-10-CM | POA: Diagnosis not present

## 2016-05-28 DIAGNOSIS — J45909 Unspecified asthma, uncomplicated: Secondary | ICD-10-CM | POA: Insufficient documentation

## 2016-05-28 DIAGNOSIS — F17210 Nicotine dependence, cigarettes, uncomplicated: Secondary | ICD-10-CM | POA: Insufficient documentation

## 2016-05-28 DIAGNOSIS — S82032A Displaced transverse fracture of left patella, initial encounter for closed fracture: Secondary | ICD-10-CM | POA: Diagnosis present

## 2016-05-28 DIAGNOSIS — G2581 Restless legs syndrome: Secondary | ICD-10-CM | POA: Insufficient documentation

## 2016-05-28 DIAGNOSIS — Z8249 Family history of ischemic heart disease and other diseases of the circulatory system: Secondary | ICD-10-CM | POA: Diagnosis not present

## 2016-05-28 DIAGNOSIS — G4730 Sleep apnea, unspecified: Secondary | ICD-10-CM | POA: Insufficient documentation

## 2016-05-28 DIAGNOSIS — Z888 Allergy status to other drugs, medicaments and biological substances status: Secondary | ICD-10-CM | POA: Insufficient documentation

## 2016-05-28 DIAGNOSIS — W19XXXA Unspecified fall, initial encounter: Secondary | ICD-10-CM | POA: Diagnosis not present

## 2016-05-28 DIAGNOSIS — Z419 Encounter for procedure for purposes other than remedying health state, unspecified: Secondary | ICD-10-CM

## 2016-05-28 DIAGNOSIS — Z833 Family history of diabetes mellitus: Secondary | ICD-10-CM | POA: Diagnosis not present

## 2016-05-28 DIAGNOSIS — F419 Anxiety disorder, unspecified: Secondary | ICD-10-CM | POA: Diagnosis not present

## 2016-05-28 HISTORY — DX: Enthesopathy, unspecified: M77.9

## 2016-05-28 HISTORY — DX: Pneumonia, unspecified organism: J18.9

## 2016-05-28 HISTORY — DX: Restless legs syndrome: G25.81

## 2016-05-28 HISTORY — PX: ORIF PATELLA: SHX5033

## 2016-05-28 HISTORY — DX: Carpal tunnel syndrome, unspecified upper limb: G56.00

## 2016-05-28 HISTORY — DX: Gastro-esophageal reflux disease without esophagitis: K21.9

## 2016-05-28 HISTORY — DX: Anxiety disorder, unspecified: F41.9

## 2016-05-28 HISTORY — DX: Other complications of anesthesia, initial encounter: T88.59XA

## 2016-05-28 HISTORY — DX: Sleep apnea, unspecified: G47.30

## 2016-05-28 HISTORY — DX: Adverse effect of unspecified anesthetic, initial encounter: T41.45XA

## 2016-05-28 HISTORY — DX: Unspecified asthma, uncomplicated: J45.909

## 2016-05-28 LAB — BASIC METABOLIC PANEL
Anion gap: 9 (ref 5–15)
BUN: 8 mg/dL (ref 6–20)
CHLORIDE: 106 mmol/L (ref 101–111)
CO2: 23 mmol/L (ref 22–32)
Calcium: 9.3 mg/dL (ref 8.9–10.3)
Creatinine, Ser: 0.73 mg/dL (ref 0.61–1.24)
GFR calc Af Amer: >60 mL/min (ref >60)
GFR calc non Af Amer: >60 mL/min (ref >60)
GLUCOSE: 91 mg/dL (ref 65–99)
POTASSIUM: 4.2 mmol/L (ref 3.5–5.1)
Sodium: 138 mmol/L (ref 135–145)

## 2016-05-28 LAB — CBC
HEMATOCRIT: 42.3 % (ref 39.0–52.0)
Hemoglobin: 14.1 g/dL (ref 13.0–17.0)
MCH: 32.3 pg (ref 26.0–34.0)
MCHC: 33.3 g/dL (ref 30.0–36.0)
MCV: 97 fL (ref 78.0–100.0)
Platelets: 339 10*3/uL (ref 150–400)
RBC: 4.36 MIL/uL (ref 4.22–5.81)
RDW: 13.7 % (ref 11.5–15.5)
WBC: 10 10*3/uL (ref 4.0–10.5)

## 2016-05-28 SURGERY — OPEN REDUCTION INTERNAL FIXATION (ORIF) PATELLA
Anesthesia: Regional | Site: Knee | Laterality: Left

## 2016-05-28 MED ORDER — MIDAZOLAM HCL 2 MG/2ML IJ SOLN
INTRAMUSCULAR | Status: AC
  Filled 2016-05-28: qty 2

## 2016-05-28 MED ORDER — CHLORHEXIDINE GLUCONATE 4 % EX LIQD: 60.0000 mL | Freq: Once | CUTANEOUS | Status: DC

## 2016-05-28 MED ORDER — OXYCODONE HCL 5 MG PO TABS: 5.0000 mg | ORAL_TABLET | Freq: Once | ORAL | Status: DC | PRN

## 2016-05-28 MED ORDER — ONDANSETRON HCL 4 MG/2ML IJ SOLN
INTRAMUSCULAR | Status: AC
  Filled 2016-05-28: qty 4

## 2016-05-28 MED ORDER — ONDANSETRON 4 MG PO TBDP: 4.0000 mg | ORAL_TABLET | Freq: Three times a day (TID) | ORAL | 0 refills | Status: AC | PRN

## 2016-05-28 MED ORDER — BUPIVACAINE-EPINEPHRINE (PF) 0.5% -1:200000 IJ SOLN
INTRAMUSCULAR | Status: DC | PRN
  Administered 2016-05-28: 30 mL via PERINEURAL

## 2016-05-28 MED ORDER — PROPOFOL 10 MG/ML IV BOLUS
INTRAVENOUS | Status: AC
  Filled 2016-05-28: qty 40

## 2016-05-28 MED ORDER — PROPOFOL 10 MG/ML IV BOLUS
INTRAVENOUS | Status: DC | PRN
  Administered 2016-05-28: 250 mg via INTRAVENOUS

## 2016-05-28 MED ORDER — LIDOCAINE HCL (CARDIAC) 20 MG/ML IV SOLN
INTRAVENOUS | Status: DC | PRN
  Administered 2016-05-28: 80 mg via INTRAVENOUS

## 2016-05-28 MED ORDER — 0.9 % SODIUM CHLORIDE (POUR BTL) OPTIME
TOPICAL | Status: DC | PRN
  Administered 2016-05-28: 1000 mL

## 2016-05-28 MED ORDER — FENTANYL CITRATE (PF) 100 MCG/2ML IJ SOLN
INTRAMUSCULAR | Status: DC | PRN
  Administered 2016-05-28: 25 ug via INTRAVENOUS
  Administered 2016-05-28 (×2): 50 ug via INTRAVENOUS
  Administered 2016-05-28: 25 ug via INTRAVENOUS
  Administered 2016-05-28: 50 ug via INTRAVENOUS

## 2016-05-28 MED ORDER — FENTANYL CITRATE (PF) 100 MCG/2ML IJ SOLN
INTRAMUSCULAR | Status: AC
  Filled 2016-05-28: qty 2

## 2016-05-28 MED ORDER — LIDOCAINE 2% (20 MG/ML) 5 ML SYRINGE
INTRAMUSCULAR | Status: AC
  Filled 2016-05-28: qty 5

## 2016-05-28 MED ORDER — PHENYLEPHRINE HCL 10 MG/ML IJ SOLN
INTRAMUSCULAR | Status: DC | PRN
  Administered 2016-05-28 (×3): 80 ug via INTRAVENOUS

## 2016-05-28 MED ORDER — MIDAZOLAM HCL 2 MG/2ML IJ SOLN
INTRAMUSCULAR | Status: AC
  Administered 2016-05-28: 2 mg
  Filled 2016-05-28: qty 2

## 2016-05-28 MED ORDER — PHENYLEPHRINE HCL 10 MG/ML IJ SOLN
INTRAVENOUS | Status: DC | PRN
  Administered 2016-05-28: 50 ug/min via INTRAVENOUS

## 2016-05-28 MED ORDER — ONDANSETRON HCL 4 MG/2ML IJ SOLN: 4.0000 mg | Freq: Four times a day (QID) | INTRAMUSCULAR | Status: DC | PRN

## 2016-05-28 MED ORDER — MIDAZOLAM HCL 5 MG/5ML IJ SOLN
INTRAMUSCULAR | Status: DC | PRN
  Administered 2016-05-28: 2 mg via INTRAVENOUS

## 2016-05-28 MED ORDER — HYDROMORPHONE HCL 1 MG/ML IJ SOLN
0.2500 mg | INTRAMUSCULAR | Status: DC | PRN
  Administered 2016-05-28: 0.5 mg via INTRAVENOUS

## 2016-05-28 MED ORDER — HYDROMORPHONE HCL 2 MG/ML IJ SOLN
INTRAMUSCULAR | Status: AC
  Filled 2016-05-28: qty 1

## 2016-05-28 MED ORDER — ONDANSETRON HCL 4 MG/2ML IJ SOLN
INTRAMUSCULAR | Status: DC | PRN
  Administered 2016-05-28: 4 mg via INTRAVENOUS

## 2016-05-28 MED ORDER — OXYCODONE HCL 5 MG/5ML PO SOLN: 5.0000 mg | Freq: Once | ORAL | Status: DC | PRN

## 2016-05-28 MED ORDER — FENTANYL CITRATE (PF) 100 MCG/2ML IJ SOLN
INTRAMUSCULAR | Status: AC
  Administered 2016-05-28: 100 ug
  Filled 2016-05-28: qty 2

## 2016-05-28 MED ORDER — LACTATED RINGERS IV SOLN
INTRAVENOUS | Status: DC
  Administered 2016-05-28: 17:00:00 via INTRAVENOUS
  Administered 2016-05-28: 50 mL/h via INTRAVENOUS

## 2016-05-28 SURGICAL SUPPLY — 65 items
BANDAGE ACE 4X5 VEL STRL LF (GAUZE/BANDAGES/DRESSINGS) IMPLANT
BANDAGE ACE 6X5 VEL STRL LF (GAUZE/BANDAGES/DRESSINGS) ×1 IMPLANT
BIT DRILL CANN 2.7X625 NONSTRL (BIT) ×1 IMPLANT
BNDG COHESIVE 4X5 TAN STRL (GAUZE/BANDAGES/DRESSINGS) IMPLANT
BNDG COHESIVE 6X5 TAN STRL LF (GAUZE/BANDAGES/DRESSINGS) ×1 IMPLANT
COVER SURGICAL LIGHT HANDLE (MISCELLANEOUS) ×2 IMPLANT
CUFF TOURNIQUET SINGLE 34IN LL (TOURNIQUET CUFF) ×1 IMPLANT
DRAPE C-ARM 42X72 X-RAY (DRAPES) ×1 IMPLANT
DRAPE C-ARMOR (DRAPES) ×1 IMPLANT
DRAPE IMP U-DRAPE 54X76 (DRAPES) ×2 IMPLANT
DRAPE INCISE IOBAN 66X45 STRL (DRAPES) ×1 IMPLANT
DRAPE U-SHAPE 47X51 STRL (DRAPES) ×1 IMPLANT
DRSG PAD ABDOMINAL 8X10 ST (GAUZE/BANDAGES/DRESSINGS) ×1 IMPLANT
DURAPREP 26ML APPLICATOR (WOUND CARE) ×2 IMPLANT
ELECT CAUTERY BLADE 6.4 (BLADE) ×2 IMPLANT
ELECT REM PT RETURN 9FT ADLT (ELECTROSURGICAL) ×2
ELECTRODE REM PT RTRN 9FT ADLT (ELECTROSURGICAL) ×1 IMPLANT
GAUZE SPONGE 4X4 12PLY STRL (GAUZE/BANDAGES/DRESSINGS) ×2 IMPLANT
GAUZE XEROFORM 5X9 LF (GAUZE/BANDAGES/DRESSINGS) ×1 IMPLANT
GLOVE BIO SURGEON STRL SZ 6 (GLOVE) ×1 IMPLANT
GLOVE BIO SURGEON STRL SZ7 (GLOVE) ×1 IMPLANT
GLOVE BIO SURGEON STRL SZ7.5 (GLOVE) ×1 IMPLANT
GLOVE BIOGEL PI IND STRL 7.0 (GLOVE) IMPLANT
GLOVE BIOGEL PI IND STRL 7.5 (GLOVE) IMPLANT
GLOVE BIOGEL PI IND STRL 8 (GLOVE) IMPLANT
GLOVE BIOGEL PI INDICATOR 7.0 (GLOVE) ×1
GLOVE BIOGEL PI INDICATOR 7.5 (GLOVE) ×1
GLOVE BIOGEL PI INDICATOR 8 (GLOVE) ×1
GLOVE ECLIPSE 7.0 STRL STRAW (GLOVE) ×1 IMPLANT
GOWN STRL REUS W/ TWL LRG LVL3 (GOWN DISPOSABLE) IMPLANT
GOWN STRL REUS W/TWL LRG LVL3 (GOWN DISPOSABLE) ×8
GUIDEWARE NON THREAD 1.25X150 (WIRE) ×4
GUIDEWIRE NON THREAD 1.25X150 (WIRE) IMPLANT
KIT BASIN OR (CUSTOM PROCEDURE TRAY) ×2 IMPLANT
KIT ROOM TURNOVER OR (KITS) ×2 IMPLANT
NDL HYPO 25GX1X1/2 BEV (NEEDLE) IMPLANT
NEEDLE HYPO 25GX1X1/2 BEV (NEEDLE) IMPLANT
NS IRRIG 1000ML POUR BTL (IV SOLUTION) ×2 IMPLANT
PACK ORTHO EXTREMITY (CUSTOM PROCEDURE TRAY) ×2 IMPLANT
PAD ARMBOARD 7.5X6 YLW CONV (MISCELLANEOUS) ×4 IMPLANT
PAD CAST 3X4 CTTN HI CHSV (CAST SUPPLIES) IMPLANT
PADDING CAST COTTON 3X4 STRL (CAST SUPPLIES)
PADDING CAST COTTON 6X4 STRL (CAST SUPPLIES) ×1 IMPLANT
PASSER SUT SWANSON 36MM LOOP (INSTRUMENTS) ×1 IMPLANT
RETRIEVER SUT HEWSON (MISCELLANEOUS) ×1 IMPLANT
SCREW CANN S THRD/30 4.0 (Screw) ×2 IMPLANT
SCREW SHORT THREAD 4.0X46 (Screw) ×1 IMPLANT
SPONGE LAP 18X18 X RAY DECT (DISPOSABLE) ×1 IMPLANT
STOCKINETTE IMPERVIOUS LG (DRAPES) ×2 IMPLANT
SUCTION FRAZIER HANDLE 10FR (MISCELLANEOUS) ×1
SUCTION TUBE FRAZIER 10FR DISP (MISCELLANEOUS) ×1 IMPLANT
SUT ETHIBOND 5 LR DA (SUTURE) ×3 IMPLANT
SUT ETHILON 2 0 FS 18 (SUTURE) ×1 IMPLANT
SUT ETHILON 3 0 PS 1 (SUTURE) IMPLANT
SUT FIBERWIRE #2 38 T-5 BLUE (SUTURE) ×2
SUT VIC AB 0 CT1 27 (SUTURE) ×2
SUT VIC AB 0 CT1 27XBRD ANBCTR (SUTURE) IMPLANT
SUT VIC AB 2-0 CT1 27 (SUTURE) ×4
SUT VIC AB 2-0 CT1 TAPERPNT 27 (SUTURE) IMPLANT
SUTURE FIBERWR #2 38 T-5 BLUE (SUTURE) IMPLANT
SYR CONTROL 10ML LL (SYRINGE) IMPLANT
TOWEL OR 17X24 6PK STRL BLUE (TOWEL DISPOSABLE) ×1 IMPLANT
TOWEL OR 17X26 10 PK STRL BLUE (TOWEL DISPOSABLE) ×3 IMPLANT
TUBE CONNECTING 12X1/4 (SUCTIONS) ×2 IMPLANT
YANKAUER SUCT BULB TIP NO VENT (SUCTIONS) ×1 IMPLANT

## 2016-05-28 NOTE — Anesthesia Procedure Notes (Signed)
Anesthesia Regional Block:  Femoral nerve block  Pre-Anesthetic Checklist: ,, timeout performed, Correct Patient, Correct Site, Correct Laterality, Correct Procedure,, site marked, risks and benefits discussed, Surgical consent,  Pre-op evaluation,  At surgeon's request and post-op pain management  Laterality: Left  Prep: chloraprep       Needles:  Injection technique: Single-shot  Needle Type: Echogenic Stimulator Needle     Needle Length: 9cm 9 cm Needle Gauge: 21 and 21 G    Additional Needles:  Procedures: nerve stimulator Femoral nerve block  Nerve Stimulator or Paresthesia:  Response: Quadriceps muscle contraction, 0.45 mA,   Additional Responses:   Narrative:  Start time: 05/28/2016 2:40 PM End time: 05/28/2016 2:50 PM Injection made incrementally with aspirations every 5 mL.  Performed by: Personally  Anesthesiologist: Jonathan Corpus  Additional Notes: Functioning IV was confirmed and monitors were applied.  A 90mm 21ga Arrow echogenic stimulator needle was used. Sterile prep and drape,hand hygiene and sterile gloves were used.  Negative aspiration and negative test dose prior to incremental administration of local anesthetic. The patient tolerated the procedure well.

## 2016-05-28 NOTE — Anesthesia Preprocedure Evaluation (Signed)
Anesthesia Evaluation  Patient identified by MRN, date of birth, ID band Patient awake    Reviewed: Allergy & Precautions, H&P , NPO status , Patient's Chart, lab work & pertinent test results  Airway Mallampati: II   Neck ROM: full    Dental   Pulmonary asthma , sleep apnea , Current Smoker,    breath sounds clear to auscultation       Cardiovascular hypertension,  Rhythm:regular Rate:Normal     Neuro/Psych Anxiety  Neuromuscular disease    GI/Hepatic GERD  ,  Endo/Other    Renal/GU      Musculoskeletal   Abdominal   Peds  Hematology   Anesthesia Other Findings   Reproductive/Obstetrics                             Anesthesia Physical Anesthesia Plan  ASA: II  Anesthesia Plan: General and Regional   Post-op Pain Management:  Regional for Post-op pain   Induction: Intravenous  Airway Management Planned: LMA  Additional Equipment:   Intra-op Plan:   Post-operative Plan:   Informed Consent: I have reviewed the patients History and Physical, chart, labs and discussed the procedure including the risks, benefits and alternatives for the proposed anesthesia with the patient or authorized representative who has indicated his/her understanding and acceptance.     Plan Discussed with: CRNA, Anesthesiologist and Surgeon  Anesthesia Plan Comments:         Anesthesia Quick Evaluation

## 2016-05-28 NOTE — Anesthesia Procedure Notes (Signed)
Procedure Name: LMA Insertion Date/Time: 05/28/2016 3:56 PM Performed by: Lucinda DellECARLO, Magnus Crescenzo M Pre-anesthesia Checklist: Patient identified, Emergency Drugs available, Suction available and Patient being monitored Patient Re-evaluated:Patient Re-evaluated prior to inductionOxygen Delivery Method: Circle system utilized Preoxygenation: Pre-oxygenation with 100% oxygen Intubation Type: IV induction Ventilation: Mask ventilation without difficulty LMA: LMA inserted LMA Size: 5.0 Tube type: Oral Number of attempts: 1 Placement Confirmation: positive ETCO2 and breath sounds checked- equal and bilateral Tube secured with: Tape Dental Injury: Teeth and Oropharynx as per pre-operative assessment

## 2016-05-28 NOTE — H&P (Signed)
ORTHOPAEDIC CONSULTATION  REQUESTING PHYSICIAN: Nicholes Stairs, MD  PCP:  Coy Saunas, MD  Chief Complaint: Left patella fracture  HPI: Eduardo Robinson is a 40 y.o. male who complains of  Left patella fracture following a fall onto flexed knee over the weekend.  He is here today for surgery.  We met in my clinic this week and discussed the surgery and risks and benefits, and he presents for fixation.  Past Medical History:  Diagnosis Date  . Anxiety   . Asthma    childhood  . Carpal tunnel syndrome    right  . Complication of anesthesia    a little slow to wake up  . GERD (gastroesophageal reflux disease)   . Hypertension   . Pneumonia    as a child  . Restless legs   . Sleep apnea    has had study done, not gotten cpap yet  . Tendonitis    left hand   Past Surgical History:  Procedure Laterality Date  . CHOLECYSTECTOMY    . PILONIDAL CYST EXCISION     Social History   Social History  . Marital status: Married    Spouse name: N/A  . Number of children: N/A  . Years of education: N/A   Social History Main Topics  . Smoking status: Current Every Day Smoker    Packs/day: 1.00    Types: Cigarettes  . Smokeless tobacco: Never Used  . Alcohol use Yes     Comment: occasionally  . Drug use: No  . Sexual activity: Not Asked   Other Topics Concern  . None   Social History Narrative  . None   Family History  Problem Relation Age of Onset  . Diabetes Mother   . Heart attack Father   . Hypertension Father    Allergies  Allergen Reactions  . Scallops [Shellfish Allergy] Anaphylaxis  . Sudafed [Pseudoephedrine] Anaphylaxis   Prior to Admission medications   Medication Sig Start Date End Date Taking? Authorizing Provider  atorvastatin (LIPITOR) 40 MG tablet Take 40 mg by mouth at bedtime.   Yes Historical Provider, MD  ibuprofen (ADVIL,MOTRIN) 200 MG tablet Take 600-800 mg by mouth every 6 (six) hours as needed for headache or moderate pain.    Yes Historical Provider, MD  lisinopril (PRINIVIL,ZESTRIL) 10 MG tablet Take 10 mg by mouth at bedtime. 05/25/16  Yes Historical Provider, MD  Multiple Vitamin (MULTIVITAMIN WITH MINERALS) TABS tablet Take 1 tablet by mouth daily.   Yes Historical Provider, MD  Omega-3 Fatty Acids (FISH OIL PO) Take 1 capsule by mouth daily.   Yes Historical Provider, MD  oxyCODONE-acetaminophen (PERCOCET/ROXICET) 5-325 MG tablet Take 1 tablet by mouth at bedtime.  05/25/16  Yes Historical Provider, MD  PARoxetine (PAXIL) 30 MG tablet Take 30 mg by mouth at bedtime.  05/25/16  Yes Historical Provider, MD   No results found.  Positive ROS: All other systems have been reviewed and were otherwise negative with the exception of those mentioned in the HPI and as above.  Physical Exam: General: Alert, no acute distress Cardiovascular: No pedal edema Respiratory: No cyanosis, no use of accessory musculature GI: No organomegaly, abdomen is soft and non-tender Skin: No lesions in the area of chief complaint Neurologic: Sensation intact distally Psychiatric: Patient is competent for consent with normal mood and affect Lymphatic: No axillary or cervical lymphadenopathy  MUSCULOSKELETAL: LLE Ecchymosis about the knee, no open wounds, + NVI, ttp about patella  Assessment: Left transverse patella fracture  Plan: -plan of ORIF -The risks, benefits, and alternatives were discussed with the patient. There are risks associated with the surgery including, but not limited to, problems with anesthesia (death), infection, differences in leg length/angulation/rotation, fracture of bones, loosening or failure of implants, malunion, nonunion, hematoma (blood accumulation) which may require surgical drainage, blood clots, pulmonary embolism, nerve injury (foot drop), and blood vessel injury. The patient understands these risks and elects to proceed. -dc home from pacu -will be WBAT in [place] post op    Nicholes Stairs,  MD Cell [phone]    05/28/2016 3:34 PM

## 2016-05-28 NOTE — Brief Op Note (Signed)
05/28/2016  5:29 PM  PATIENT:  Eduardo Robinson  40 y.o. male  PRE-OPERATIVE DIAGNOSIS:  LEFT PATELLA FRACTURE  POST-OPERATIVE DIAGNOSIS:  LEFT PATELLA FRACTURE  PROCEDURE:  Procedure(s): OPEN REDUCTION INTERNAL (ORIF) FIXATION LEFT KNEE PATELLA (Left)  SURGEON:  Surgeon(s) and Role:    * Yolonda KidaJason Patrick Marnesha Gagen, MD - Primary  PHYSICIAN ASSISTANT:   ASSISTANTS: none   ANESTHESIA:   general and regional  EBL:  Total I/O In: 1200 [I.V.:1200] Out: 50 [Blood:50]  BLOOD ADMINISTERED:none  DRAINS: none   LOCAL MEDICATIONS USED:  NONE  SPECIMEN:  No Specimen  DISPOSITION OF SPECIMEN:  N/A  COUNTS:  YES  TOURNIQUET:   Total Tourniquet Time Documented: Thigh (Left) - 75 minutes Total: Thigh (Left) - 75 minutes   DICTATION: .Note written in EPIC  PLAN OF CARE: Discharge to home after PACU  PATIENT DISPOSITION:  PACU - hemodynamically stable.   Delay start of Pharmacological VTE agent (>24hrs) due to surgical blood loss or risk of bleeding: not applicable

## 2016-05-29 ENCOUNTER — Encounter (HOSPITAL_COMMUNITY): Payer: Self-pay | Admitting: Orthopedic Surgery

## 2016-05-29 NOTE — Op Note (Signed)
05/28/2016  PATIENT:  Eduardo Robinson    PRE-OPERATIVE DIAGNOSIS:  LEFT PATELLA FRACTURE  POST-OPERATIVE DIAGNOSIS:  Same  PROCEDURE:  OPEN REDUCTION INTERNAL (ORIF) FIXATION LEFT KNEE PATELLA  SURGEON:  Yolonda KidaJason Patrick Jeane Cashatt, MD  ANESTHESIA:   General  PREOPERATIVE INDICATIONS:  Eduardo Robinson is a  40 y.o. male with a diagnosis of LEFT PATELLA FRACTURE who elected for surgical management in order to restore the function of the extensor mechanism.    The risks benefits and alternatives were discussed with the patient preoperatively including but not limited to the risks of infection, bleeding, nerve injury, cardiopulmonary complications, the need for revision surgery, hardware prominence, hardware failure, the need for hardware removal, nonunion, malunion, posttraumatic arthritis, stiffness, loss of strength and function, among others, and the patient was willing to proceed.  OPERATIVE IMPLANTS: 4.0 mm cannulated screws x2 with a #2 FiberWire going through the cannulated screws in a figure-of-eight cerclage fashion  OPERATIVE FINDINGS: Displaced transverse patella fracture  Tourniquet Time:  74 min.  OPERATIVE PROCEDURE: The patient was brought to the operating room and placed in the supine position. General anesthesia was administered. IV antibiotics were given. The lower extremity was prepped and draped in usual sterile fashion. The leg was elevated and exsanguinated and the tourniquet was inflated. Time out was performed.   Anterior incision was made over the patella and the fracture fragments identified and cleaned of hematoma. The retinaculum was torn on either side.  I reduced the fracture anatomically and held provisionally with a clamp and placed 2 guidewires for the cannulated screws.  The lengths were measured, after being confirmed on C-arm, and then I placed the screws, taking care to make sure that there were threads only on the distal segment, providing  compression at the fracture site, and the tips were not prominent proximally or distally.  C-arm used to confirm reduction and position of the screws, and once I was satisfied with this I then used a Keith needle through the screws bringing a  #2 FiberWire in a figure-of-eight type fashion. This provided excellent secondary fixation. I left the screw lengths slightly short of the far cortex in order to minimize the risk for rupture of the FiberWire over the tip of the screws.  The wounds were irrigated copiously and the retinaculum repaired with Vicryl followed by Vicryl for the subcutaneous tissue with nylon for the skin and sterile gauze for the skin. The wounds were also injected. A knee immobilizer was applied. The patient was awakened and returned to the PACU in stable and satisfactory condition. There were no complications.  Counts were all correct.  POST OP :  He will be WBAT in the Knee immobilizer for approximately 6 weeks with PT to begin on week 2.  He will take an aspirin daily for DVT ppx and follow up with me in 2 weeks for wound check.

## 2016-05-29 NOTE — Transfer of Care (Signed)
Immediate Anesthesia Transfer of Care Note  Patient: Eduardo Robinson  Procedure(s) Performed: Procedure(s): OPEN REDUCTION INTERNAL (ORIF) FIXATION LEFT KNEE PATELLA (Left)  Patient Location: PACU  Anesthesia Type:GA combined with regional for post-op pain  Level of Consciousness: awake, alert , oriented and patient cooperative  Airway & Oxygen Therapy: Patient Spontanous Breathing and Patient connected to nasal cannula oxygen  Post-op Assessment: Report given to RN, Post -op Vital signs reviewed and stable and Patient moving all extremities  Post vital signs: Reviewed and stable  Last Vitals:  Vitals:   05/28/16 1810 05/28/16 1824  BP: 129/69 (!) 137/57  Pulse: 99 99  Resp: 16 16  Temp:      Last Pain:  Vitals:   05/28/16 1740  TempSrc:   PainSc: 0-No pain      Patients Stated Pain Goal: 3 (05/28/16 1301)  Complications: No apparent anesthesia complications

## 2016-05-29 NOTE — Anesthesia Postprocedure Evaluation (Signed)
Anesthesia Post Note  Patient: Eduardo Robinson  Procedure(s) Performed: Procedure(s) (LRB): OPEN REDUCTION INTERNAL (ORIF) FIXATION LEFT KNEE PATELLA (Left)  Patient location during evaluation: PACU Anesthesia Type: General Level of consciousness: awake and alert and patient cooperative Pain management: pain level controlled Vital Signs Assessment: post-procedure vital signs reviewed and stable Respiratory status: spontaneous breathing and respiratory function stable Cardiovascular status: stable Anesthetic complications: no    Last Vitals:  Vitals:   05/28/16 1810 05/28/16 1824  BP: 129/69 (!) 137/57  Pulse: 99 99  Resp: 16 16  Temp:      Last Pain:  Vitals:   05/28/16 1740  TempSrc:   PainSc: 0-No pain                 Eurika Sandy S

## 2017-10-02 IMAGING — RF DG C-ARM 61-120 MIN
1 series · 4 of 4 positions shown · non-contrast
Comparison: 05/23/2016

CLINICAL DATA: Patellar fracture

EXAM:
LEFT KNEE - 1-2 VIEW; DG C-ARM 61-120 MIN

[Series 1: run · 4 of 4 slices shown]
[im 1/4]
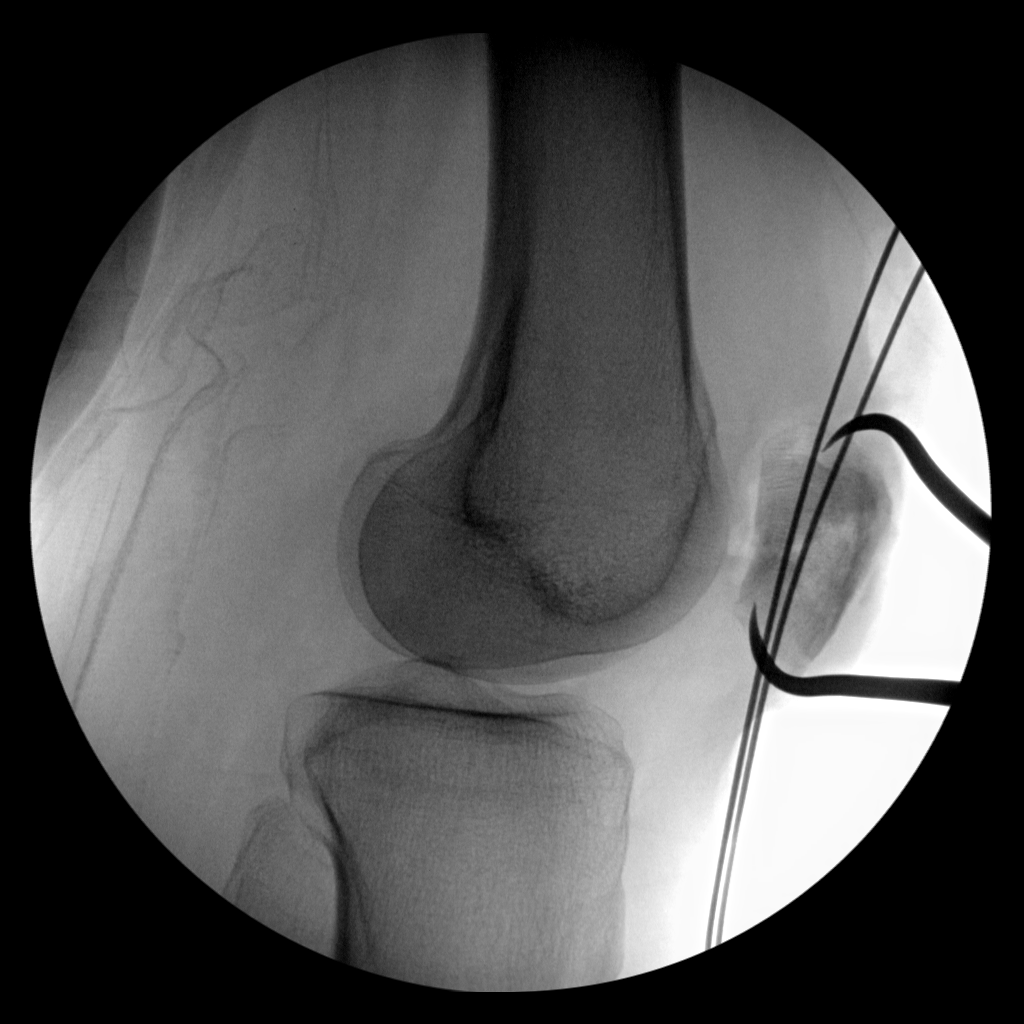
[im 2/4]
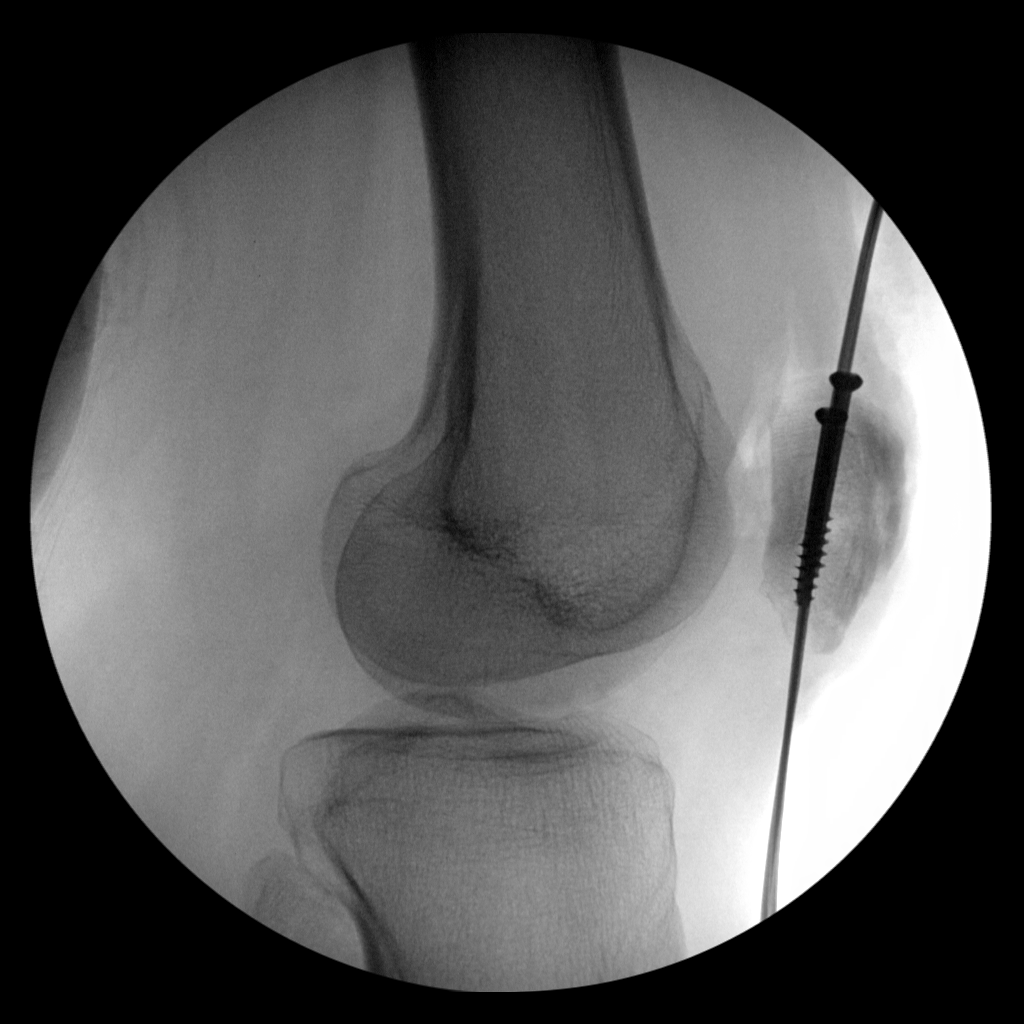
[im 3/4]
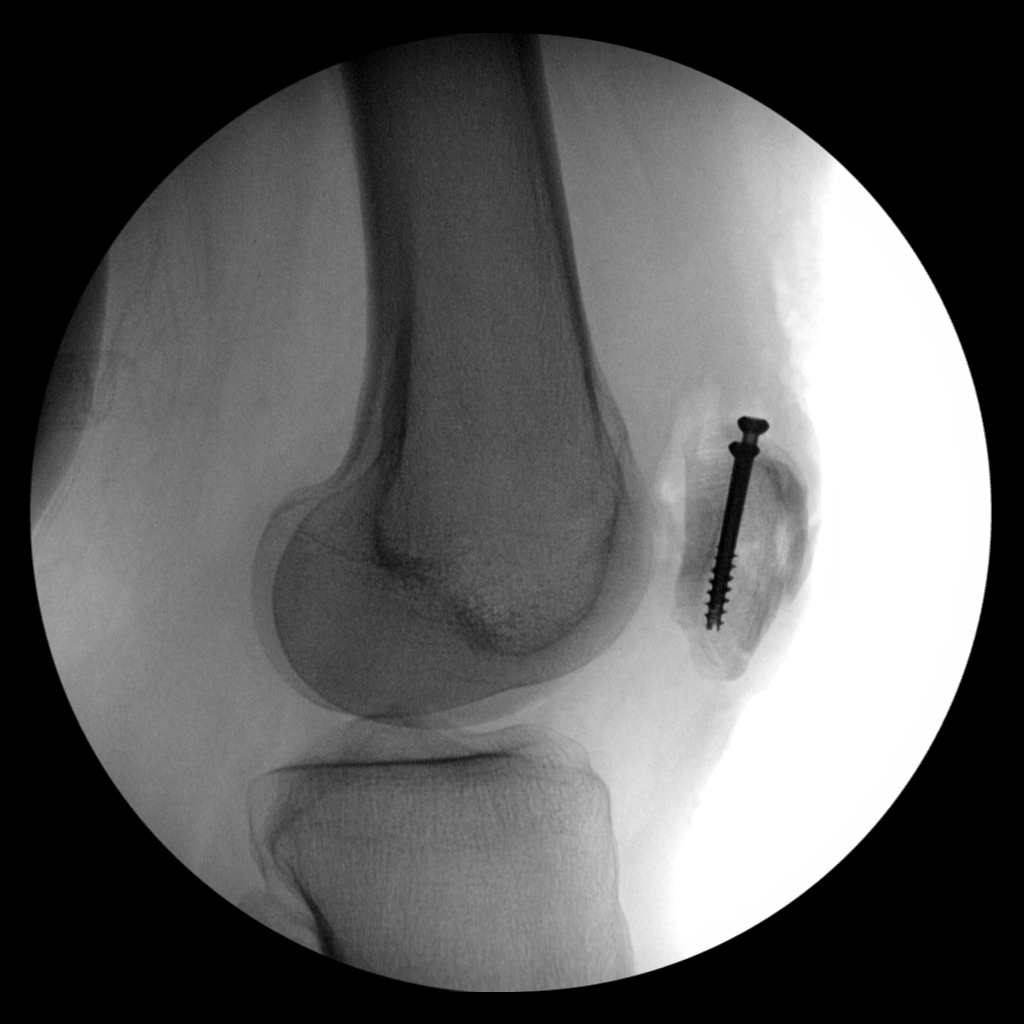
[im 4/4]
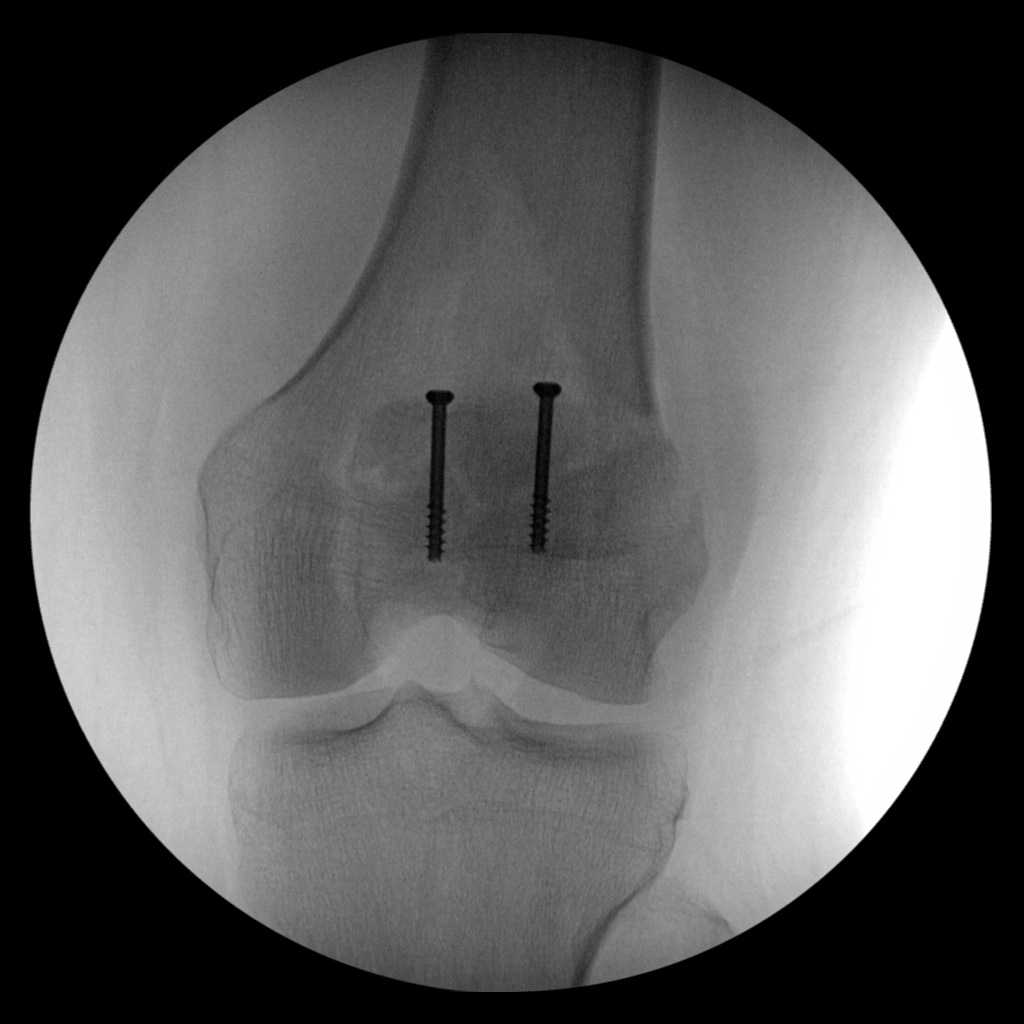

[4 of 4 positions shown; findings below may reference images not displayed]

FLUOROSCOPY TIME:  Radiation Exposure Index (as provided by the
fluoroscopic device): Not available

If the device does not provide the exposure index:

Fluoroscopy Time:  20 seconds

Number of Acquired Images:  4
FINDINGS: Initial images demonstrate the patellar fragments reapproximated
with fixation wires traversing the fragments. Fixation screws are
then placed. The fracture fragments are in near anatomic alignment.
IMPRESSION: ORIF of left patellar fracture

## 2020-10-02 ENCOUNTER — Other Ambulatory Visit: Payer: Self-pay | Admitting: Family Medicine

## 2020-10-02 DIAGNOSIS — R1013 Epigastric pain: Secondary | ICD-10-CM

## 2020-10-16 ENCOUNTER — Ambulatory Visit
Admission: RE | Admit: 2020-10-16 | Discharge: 2020-10-16 | Disposition: A | Discharge status: Home / Self Care | Payer: BC Managed Care – PPO | Source: Ambulatory Visit | Attending: Family Medicine | Admitting: Family Medicine

## 2020-10-16 DIAGNOSIS — R1013 Epigastric pain: Secondary | ICD-10-CM
